# Patient Record
Sex: Male | Born: 1985 | Race: Black or African American | Hispanic: No | Marital: Single | Smoking: Current every day smoker
Health system: Southern US, Community
[De-identification: ages and names within clinical notes are randomized; demographics above are authoritative.]

---
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
## 2007-05-12 IMAGING — CT CT HEAD WITHOUT CONTRAST
2 series · 16 of 30 positions shown, 20 images · non-contrast
Comparison: none

REASON FOR EXAM: syncope
COMMENTS:

[Series 2: without · axial · non-contrast · 0.43mm/px · z∈[-151,-21]mm · 13 of 32 slices shown, 17 images]
[im 3/32  brain]
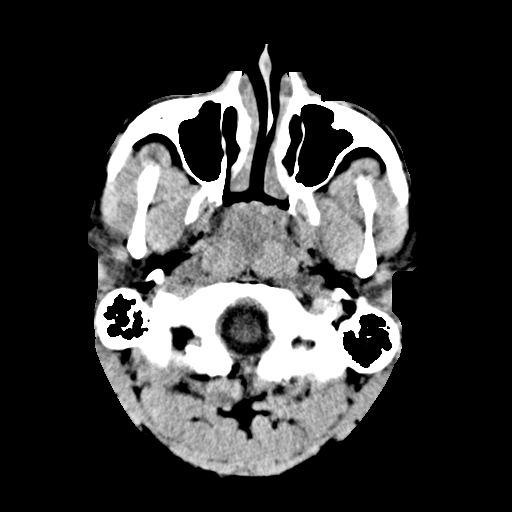
[im 3/32  bone]
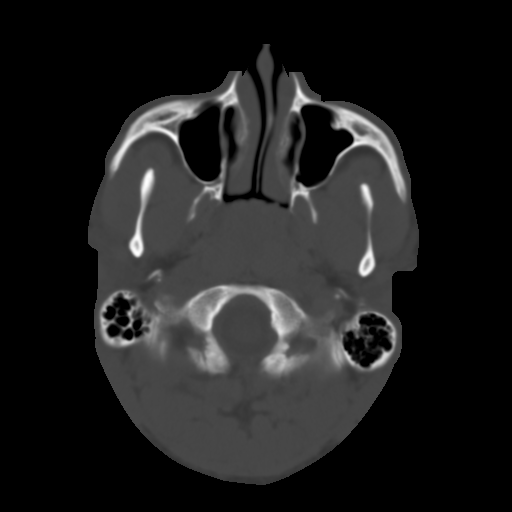
[im 5/32  brain]
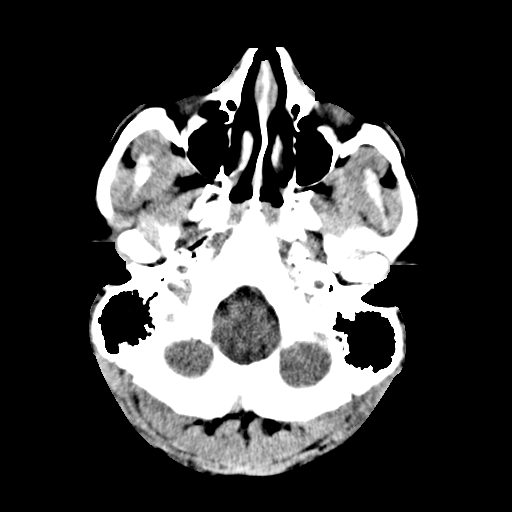
[im 7/32  brain]
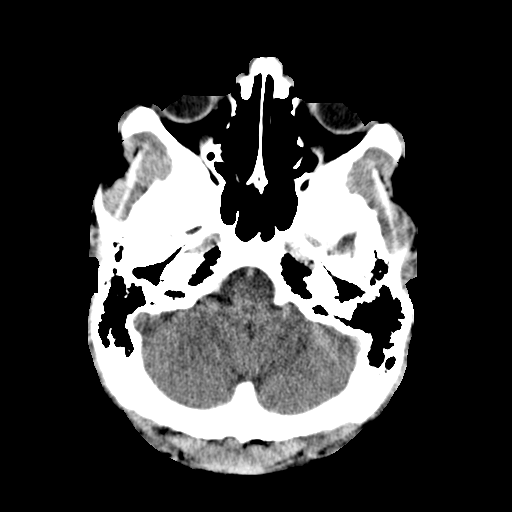
[im 9/32  brain]
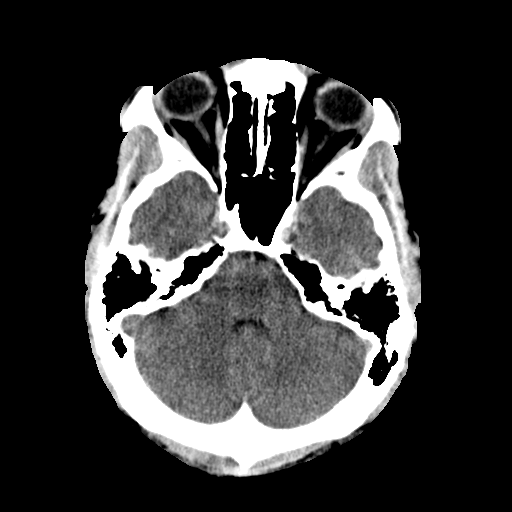
[im 12/32  brain]
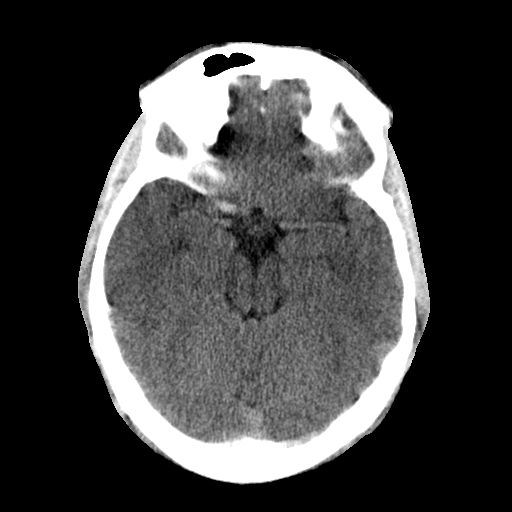
[im 12/32  bone]
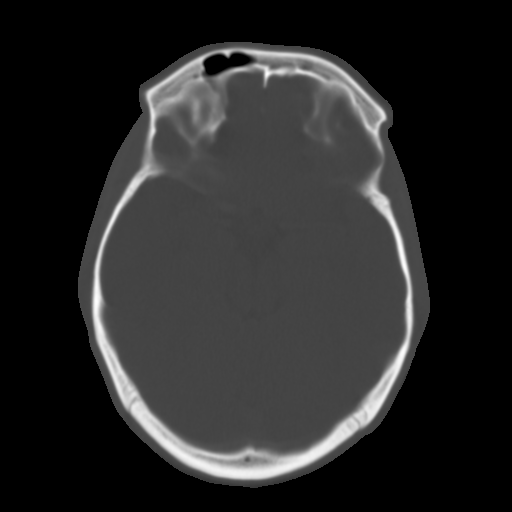
[im 14/32  brain]
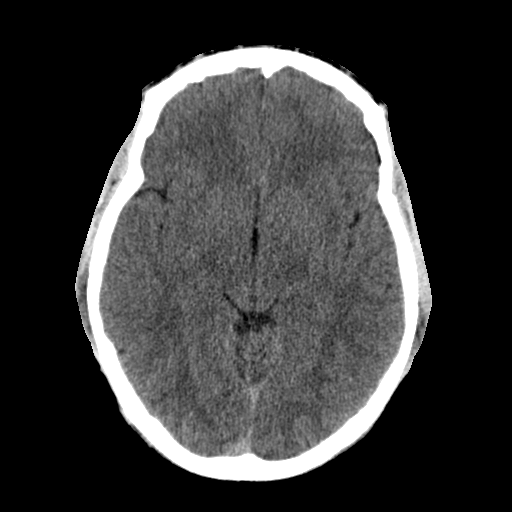
[im 16/32  brain]
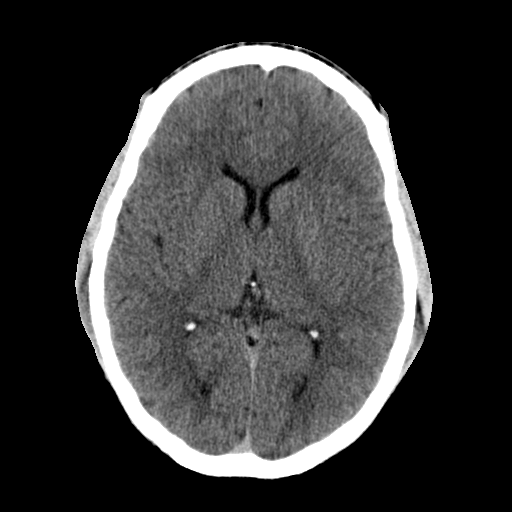
[im 18/32  brain]
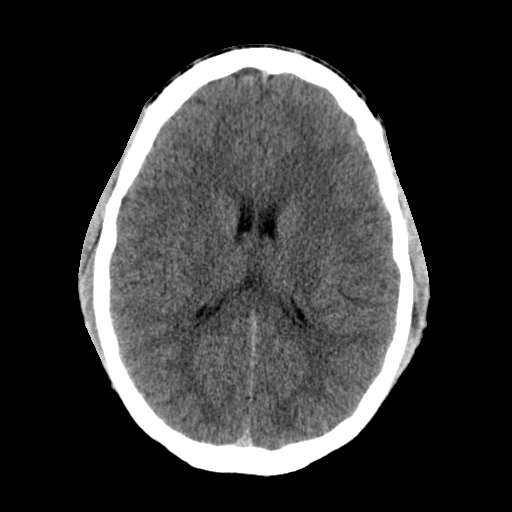
[im 20/32  brain]
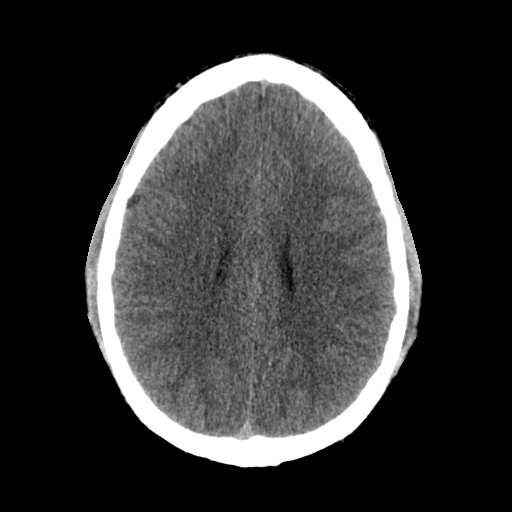
[im 20/32  bone]
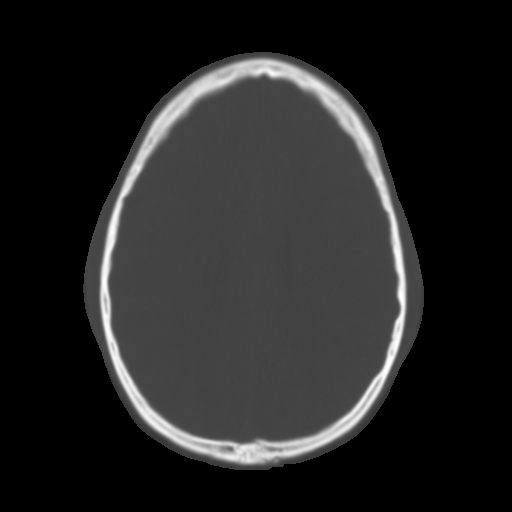
[im 23/32  brain]
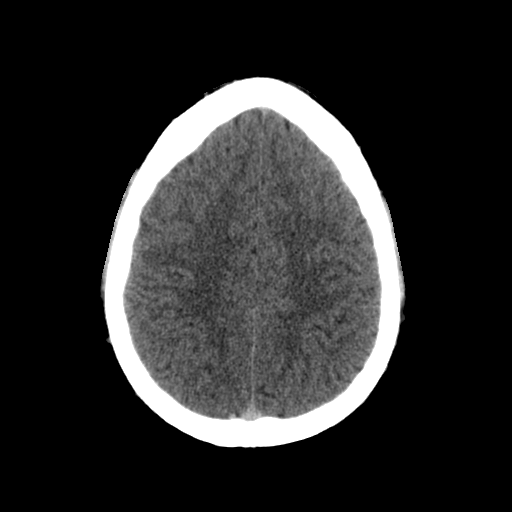
[im 25/32  brain]
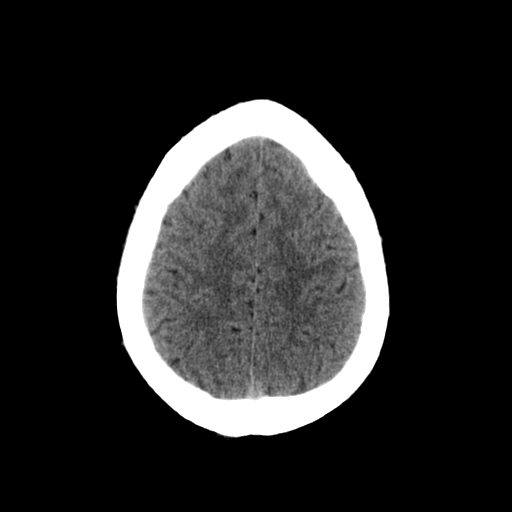
[im 27/32  brain]
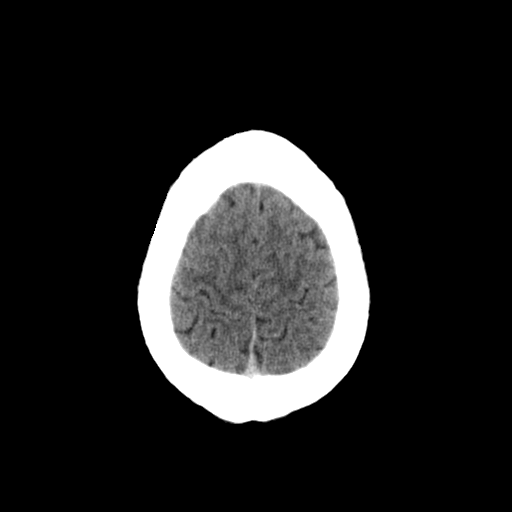
[im 29/32  brain]
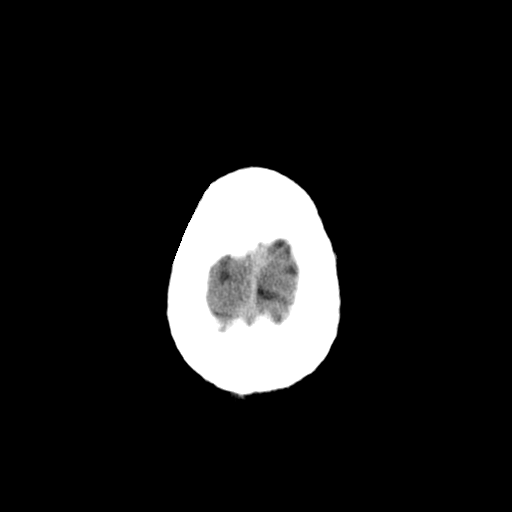
[im 29/32  bone]
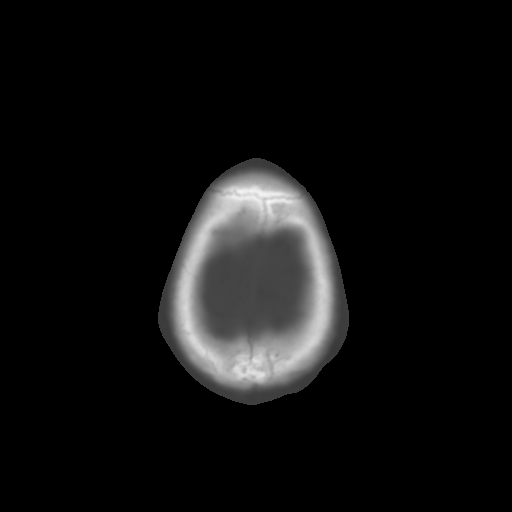

[Series 3: bone · axial · 0.43mm/px · z∈[-151,-106]mm · 3 of 32 slices shown]
[im 3/32  bone]
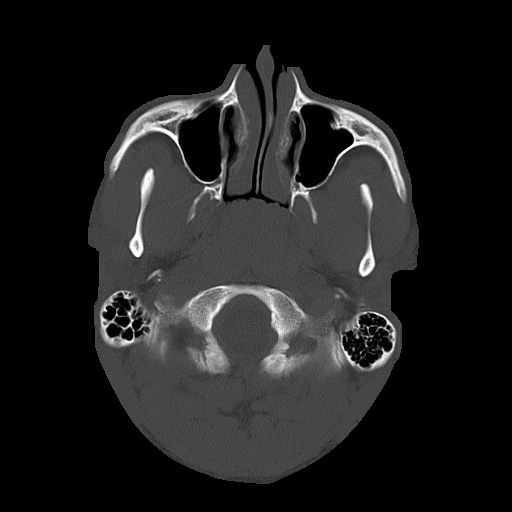
[im 7/32  bone]
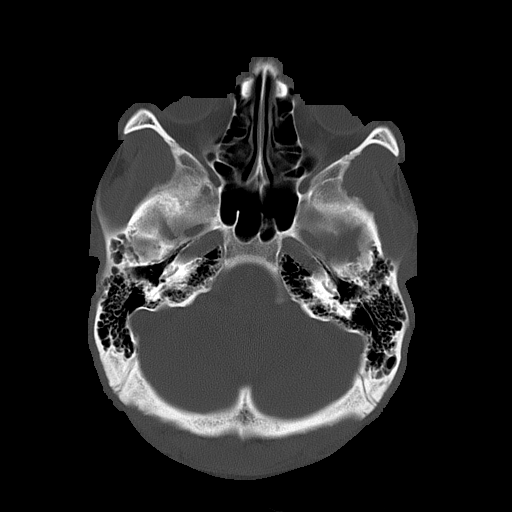
[im 12/32  bone]
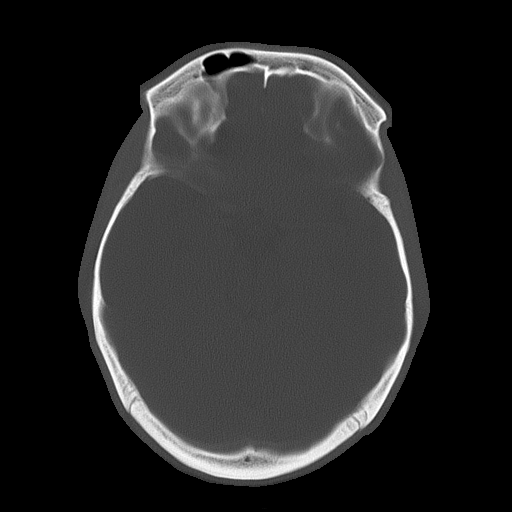

[16 of 30 positions shown; findings below may reference images not displayed]

PROCEDURE:     CT  - CT HEAD WITHOUT CONTRAST  - April 19, 2006  [DATE]

RESULT:     This is a re-dictation of a previously dictated report.

There is no evidence of intraaxial nor extraaxial fluid collections nor
evidence of acute hemorrhage.  No secondary signs are appreciated to suggest
mass effect, subacute or chronic infarction.
IMPRESSION: Dr. Klever of the emergency department was informed of these findings at the
time of the initial interpretation on 04/19/2006.

## 2009-09-03 ENCOUNTER — Emergency Department: Payer: Self-pay | Admitting: Emergency Medicine

## 2012-07-01 ENCOUNTER — Emergency Department: Payer: Self-pay | Admitting: Emergency Medicine

## 2012-07-01 LAB — CBC WITH DIFFERENTIAL/PLATELET
Basophil #: 0 10*3/uL (ref 0.0–0.1)
Eosinophil #: 0 10*3/uL (ref 0.0–0.7)
HCT: 45.9 % (ref 40.0–52.0)
Lymphocyte #: 1.1 10*3/uL (ref 1.0–3.6)
MCH: 30.8 pg (ref 26.0–34.0)
MCHC: 34.6 g/dL (ref 32.0–36.0)
MCV: 89 fL (ref 80–100)
Monocyte #: 1.2 x10 3/mm — ABNORMAL HIGH (ref 0.2–1.0)
Monocyte %: 11.3 %
Neutrophil #: 8.4 10*3/uL — ABNORMAL HIGH (ref 1.4–6.5)
Platelet: 196 10*3/uL (ref 150–440)
RBC: 5.17 10*6/uL (ref 4.40–5.90)
RDW: 13.7 % (ref 11.5–14.5)
WBC: 10.8 10*3/uL — ABNORMAL HIGH (ref 3.8–10.6)

## 2012-07-01 LAB — RAPID INFLUENZA A&B ANTIGENS

## 2012-07-01 LAB — MONONUCLEOSIS SCREEN: Mono Test: NEGATIVE

## 2012-07-05 ENCOUNTER — Emergency Department: Payer: Self-pay | Admitting: Emergency Medicine

## 2012-07-05 LAB — CBC WITH DIFFERENTIAL/PLATELET
Basophil #: 0.1 10*3/uL (ref 0.0–0.1)
Basophil %: 0.4 %
Eosinophil #: 0 10*3/uL (ref 0.0–0.7)
HCT: 45.4 % (ref 40.0–52.0)
HGB: 15.4 g/dL (ref 13.0–18.0)
Lymphocyte %: 17.6 %
MCH: 30.2 pg (ref 26.0–34.0)
MCHC: 33.9 g/dL (ref 32.0–36.0)
MCV: 89 fL (ref 80–100)
Monocyte #: 1.6 x10 3/mm — ABNORMAL HIGH (ref 0.2–1.0)
Monocyte %: 13.6 %
Neutrophil #: 8.1 10*3/uL — ABNORMAL HIGH (ref 1.4–6.5)
RDW: 13.7 % (ref 11.5–14.5)
WBC: 11.8 10*3/uL — ABNORMAL HIGH (ref 3.8–10.6)

## 2012-07-07 LAB — BETA STREP CULTURE(ARMC)

## 2014-03-15 ENCOUNTER — Emergency Department: Payer: Self-pay | Admitting: Emergency Medicine

## 2014-03-15 LAB — CBC WITH DIFFERENTIAL/PLATELET
Basophil #: 0.1 10*3/uL (ref 0.0–0.1)
Basophil %: 0.9 %
EOS ABS: 0.2 10*3/uL (ref 0.0–0.7)
EOS PCT: 1.6 %
HCT: 45.4 % (ref 40.0–52.0)
HGB: 14.8 g/dL (ref 13.0–18.0)
LYMPHS PCT: 20.7 %
Lymphocyte #: 2.1 10*3/uL (ref 1.0–3.6)
MCH: 30.2 pg (ref 26.0–34.0)
MCHC: 32.6 g/dL (ref 32.0–36.0)
MCV: 93 fL (ref 80–100)
MONOS PCT: 5.7 %
Monocyte #: 0.6 x10 3/mm (ref 0.2–1.0)
Neutrophil #: 7.2 10*3/uL — ABNORMAL HIGH (ref 1.4–6.5)
Neutrophil %: 71.1 %
Platelet: 209 10*3/uL (ref 150–440)
RBC: 4.9 10*6/uL (ref 4.40–5.90)
RDW: 13.3 % (ref 11.5–14.5)
WBC: 10.2 10*3/uL (ref 3.8–10.6)

## 2014-03-15 LAB — BASIC METABOLIC PANEL
Anion Gap: 10 (ref 7–16)
BUN: 13 mg/dL (ref 7–18)
Calcium, Total: 8.6 mg/dL (ref 8.5–10.1)
Chloride: 106 mmol/L (ref 98–107)
Co2: 28 mmol/L (ref 21–32)
Creatinine: 1.24 mg/dL (ref 0.60–1.30)
EGFR (Non-African Amer.): >60
GLUCOSE: 101 mg/dL — AB (ref 65–99)
OSMOLALITY: 287 (ref 275–301)
POTASSIUM: 3.3 mmol/L — AB (ref 3.5–5.1)
SODIUM: 144 mmol/L (ref 136–145)

## 2014-03-15 LAB — URINALYSIS, COMPLETE
BLOOD: NEGATIVE
Bacteria: NONE SEEN
Bilirubin,UR: NEGATIVE
GLUCOSE, UR: NEGATIVE mg/dL (ref 0–75)
Ketone: NEGATIVE
Leukocyte Esterase: NEGATIVE
NITRITE: NEGATIVE
Ph: 7 (ref 4.5–8.0)
Protein: NEGATIVE
RBC,UR: 10 /HPF (ref 0–5)
Specific Gravity: 1.027 (ref 1.003–1.030)
WBC UR: <1 /HPF (ref 0–5)

## 2014-03-15 LAB — TROPONIN I: Troponin-I: <0.02 ng/mL

## 2014-03-19 ENCOUNTER — Emergency Department: Payer: Self-pay | Admitting: Emergency Medicine

## 2014-03-19 LAB — CBC
HCT: 49 % (ref 40.0–52.0)
HGB: 16.5 g/dL (ref 13.0–18.0)
MCH: 30.6 pg (ref 26.0–34.0)
MCHC: 33.8 g/dL (ref 32.0–36.0)
MCV: 91 fL (ref 80–100)
Platelet: 205 10*3/uL (ref 150–440)
RBC: 5.4 10*6/uL (ref 4.40–5.90)
RDW: 12.9 % (ref 11.5–14.5)
WBC: 15.1 10*3/uL — AB (ref 3.8–10.6)

## 2014-03-19 LAB — TROPONIN I: Troponin-I: <0.02 ng/mL

## 2014-03-19 LAB — BASIC METABOLIC PANEL
Anion Gap: 8 (ref 7–16)
BUN: 13 mg/dL (ref 7–18)
CHLORIDE: 100 mmol/L (ref 98–107)
CREATININE: 1.51 mg/dL — AB (ref 0.60–1.30)
Calcium, Total: 9.3 mg/dL (ref 8.5–10.1)
Co2: 29 mmol/L (ref 21–32)
EGFR (African American): >60
EGFR (Non-African Amer.): >60
Glucose: 104 mg/dL — ABNORMAL HIGH (ref 65–99)
OSMOLALITY: 274 (ref 275–301)
POTASSIUM: 3.7 mmol/L (ref 3.5–5.1)
Sodium: 137 mmol/L (ref 136–145)

## 2014-03-21 ENCOUNTER — Emergency Department: Payer: Self-pay | Admitting: Emergency Medicine

## 2014-03-24 LAB — BETA STREP CULTURE(ARMC)

## 2015-04-11 IMAGING — CR DG CHEST 2V
1 series · 2 of 2 positions shown · non-contrast
Comparison: 03/15/2014.

CLINICAL DATA: Wheezing and congestion.  Fever.

EXAM:
CHEST  2 VIEW

[Series 1: dxr chest pa (or ap) and lateral · 0.14mm/px · 2 of 2 slices shown]
[im 1/2]
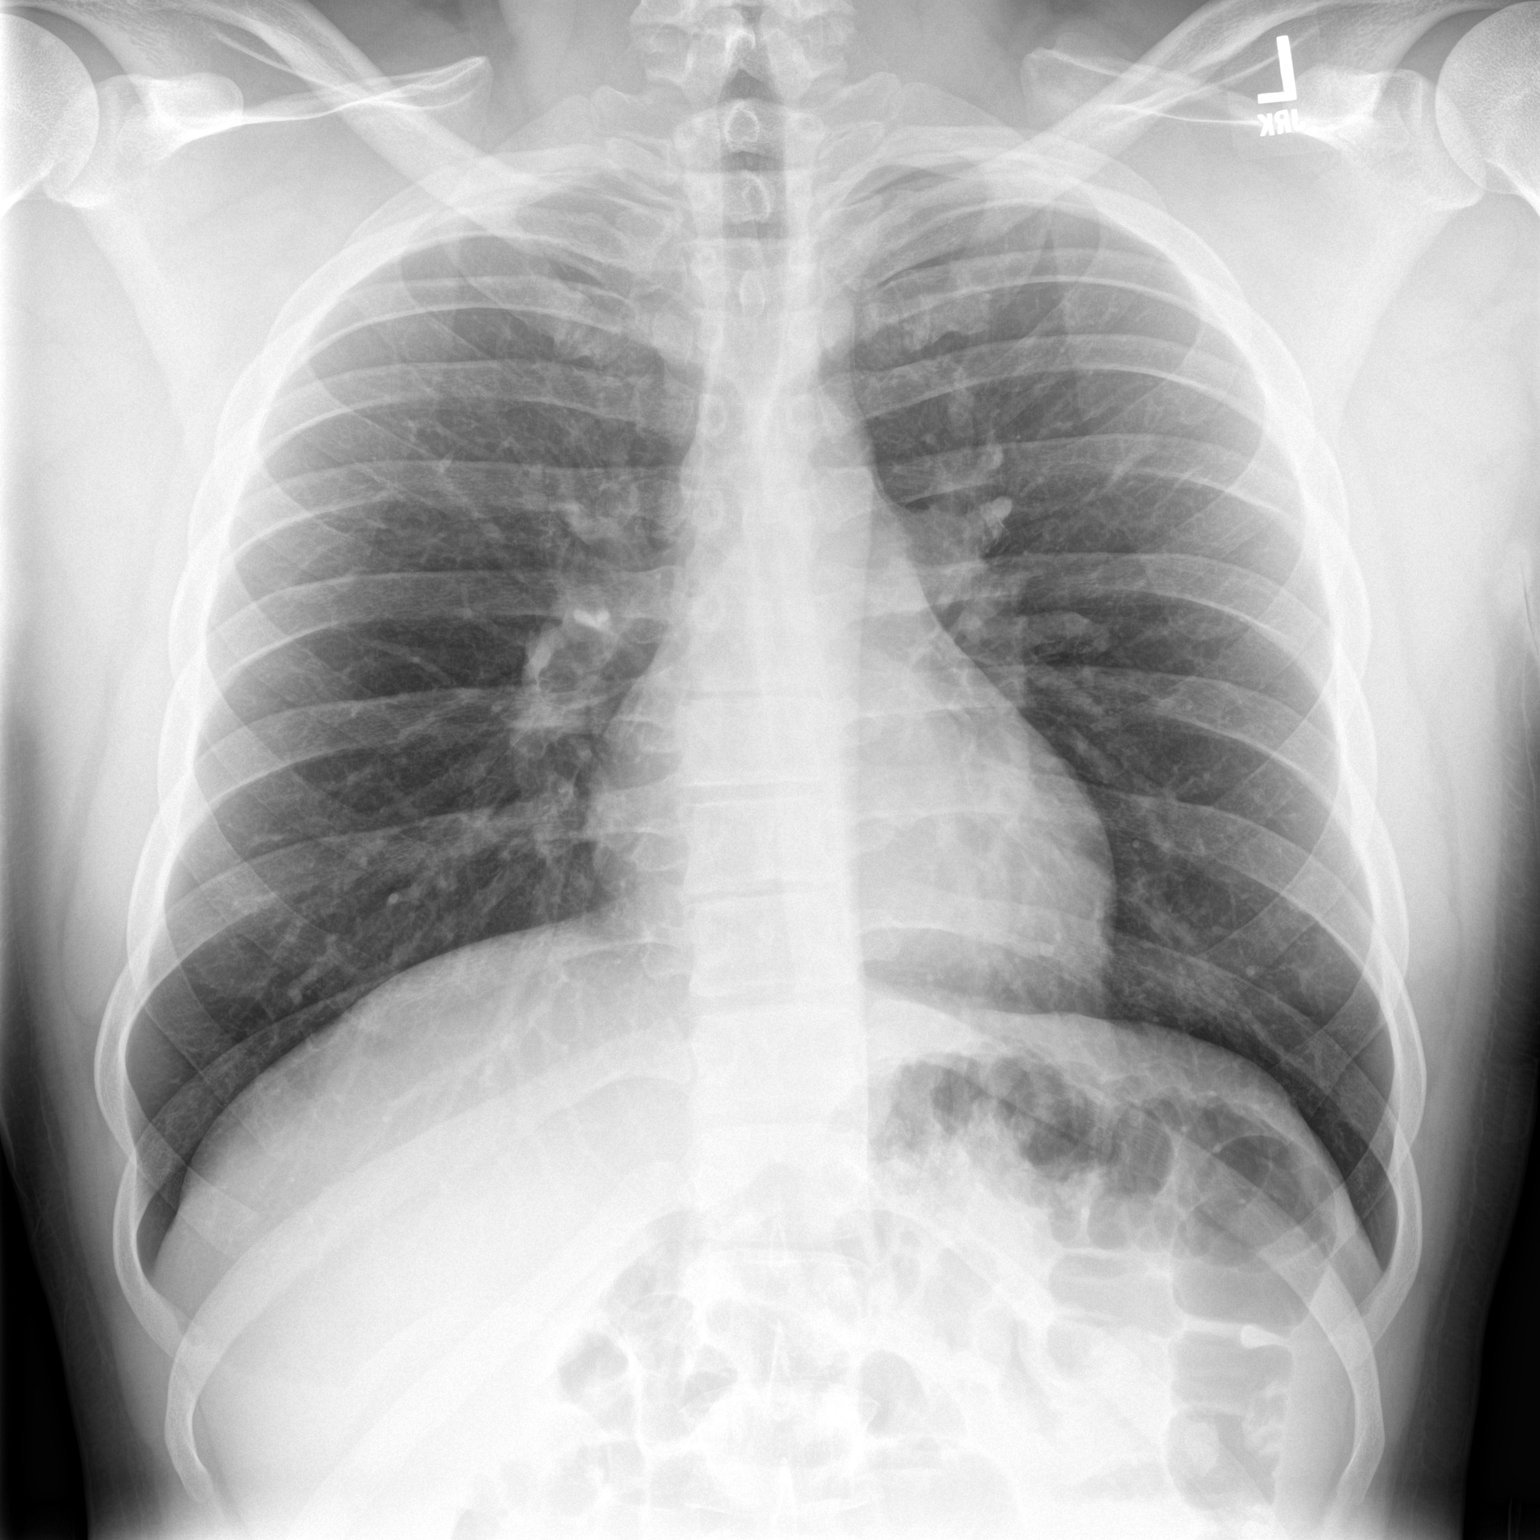
[im 2/2]
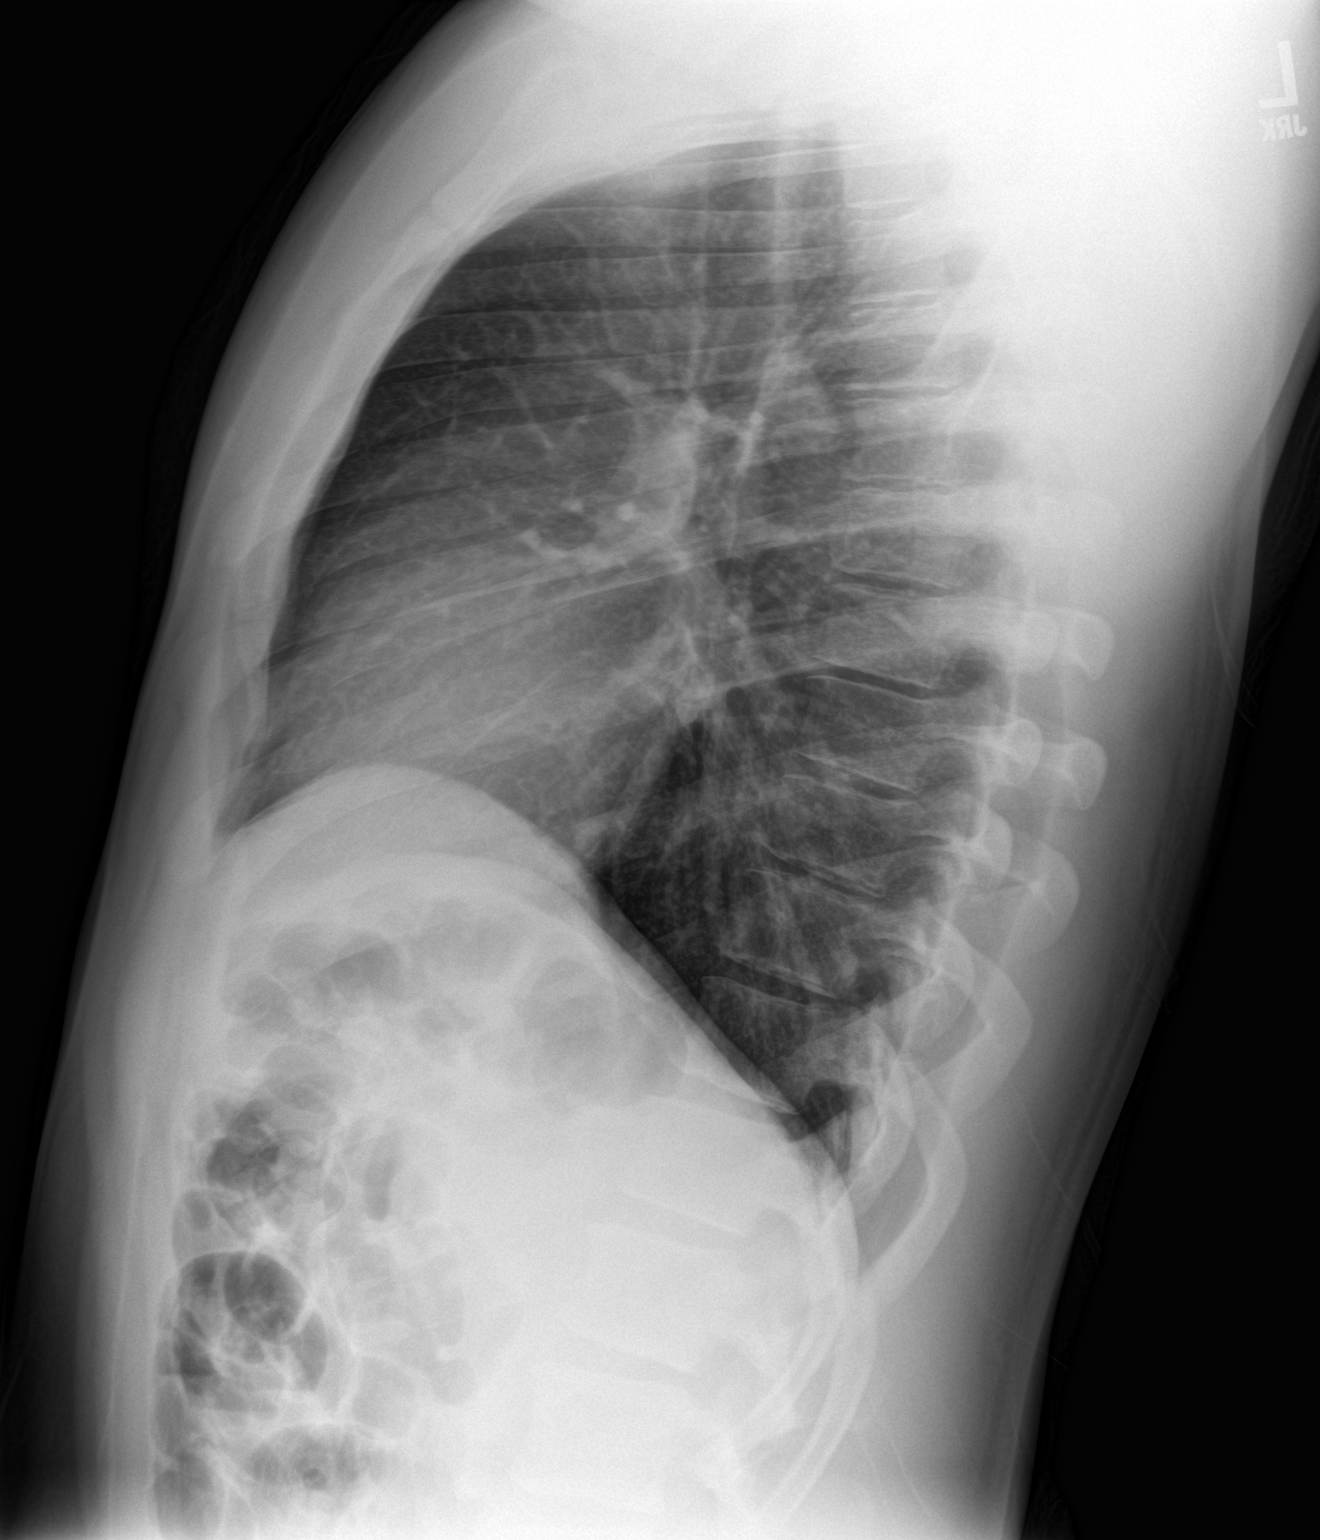

[2 of 2 positions shown; findings below may reference images not displayed]

FINDINGS: Cardiopericardial silhouette within normal limits. Mediastinal
contours normal. Trachea midline. No airspace disease or effusion.
IMPRESSION: No active cardiopulmonary disease.

## 2016-10-12 ENCOUNTER — Encounter: Payer: Self-pay | Admitting: Emergency Medicine

## 2016-10-12 ENCOUNTER — Emergency Department
Admission: EM | Admit: 2016-10-12 | Discharge: 2016-10-12 | Disposition: A | Discharge status: Home / Self Care | Payer: Self-pay | Attending: Emergency Medicine | Admitting: Emergency Medicine

## 2016-10-12 DIAGNOSIS — L03113 Cellulitis of right upper limb: Secondary | ICD-10-CM | POA: Insufficient documentation

## 2016-10-12 DIAGNOSIS — F17210 Nicotine dependence, cigarettes, uncomplicated: Secondary | ICD-10-CM | POA: Insufficient documentation

## 2016-10-12 MED ORDER — CEPHALEXIN 500 MG PO CAPS: 500.0000 mg | ORAL_CAPSULE | Freq: Two times a day (BID) | ORAL | 0 refills | Status: AC

## 2016-10-12 MED ORDER — CEPHALEXIN 500 MG PO CAPS
500.0000 mg | ORAL_CAPSULE | Freq: Once | ORAL | Status: AC
  Administered 2016-10-12: 500 mg via ORAL
  Filled 2016-10-12: qty 1

## 2016-10-12 MED ORDER — SULFAMETHOXAZOLE-TRIMETHOPRIM 800-160 MG PO TABS: 1.0000 | ORAL_TABLET | Freq: Two times a day (BID) | ORAL | 0 refills | Status: AC

## 2016-10-12 MED ORDER — SULFAMETHOXAZOLE-TRIMETHOPRIM 800-160 MG PO TABS
1.0000 | ORAL_TABLET | Freq: Once | ORAL | Status: AC
  Administered 2016-10-12: 1 via ORAL
  Filled 2016-10-12: qty 1

## 2016-10-12 NOTE — ED Notes (Signed)
Pt found in room att 

## 2016-10-12 NOTE — ED Provider Notes (Signed)
[facility] Emergency Department Provider Note   ____________________________________________    I have reviewed the triage vital signs and the nursing notes.   HISTORY  Chief Complaint Insect Bite and Hand Pain     HPI Travis Guerra. is a 31 y.o. male who presents with complaints of right hand pain and swelling. Patient reports approximately 2 days ago he was under his mother's house and either was bit by an insect or poked his hand on something sharp. He didn't think anything of it but yesterday evening developed mild redness and burning pain and swelling in between second and third fingers on the dorsum of his hand. No fevers or chills. No other injury. Tetanus is up-to-date   History reviewed. No pertinent past medical history.  There are no active problems to display for this patient.   History reviewed. No pertinent surgical history.  Prior to Admission medications   Medication Sig Start Date End Date Taking? Authorizing Provider  cephALEXin (KEFLEX) 500 MG capsule Take 1 capsule (500 mg total) by mouth 2 (two) times daily. 10/12/16   Jene Every, MD  sulfamethoxazole-trimethoprim (BACTRIM DS,SEPTRA DS) 800-160 MG tablet Take 1 tablet by mouth 2 (two) times daily. 10/12/16   Jene Every, MD     Allergies Patient has no known allergies.  History reviewed. No pertinent family history.  Social History Social History  Substance Use Topics  . Smoking status: Current Every Day Smoker    Packs/day: 0.50    Types: Cigarettes  . Smokeless tobacco: Never Used  . Alcohol use No    Review of Systems  Constitutional: No fever/chills     Gastrointestinal:No nausea, no vomiting.    Musculoskeletal: Negative forJoint pain, full range of motion of fingers Skin: .No abnormal sweating Neurological: Negative for headaches     ____________________________________________   PHYSICAL EXAM:  VITAL SIGNS: ED Triage Vitals  Enc  Vitals Group     BP 10/12/16 0302 123/80     Pulse Rate 10/12/16 0302 80     Resp 10/12/16 0302 18     Temp 10/12/16 0302 98.3 F (36.8 C)     Temp Source 10/12/16 0302 Oral     SpO2 10/12/16 0302 100 %     Weight 10/12/16 0304 210 lb (95.3 kg)     Height 10/12/16 0304  (1.803 m)     Head Circumference --      Peak Flow --      Pain Score --      Pain Loc --      Pain Edu? --      Excl. in GC? --     Constitutional: Alert and oriented. No acute distress. Pleasant and interactive  Nose: No congestion/rhinnorhea. Mouth/Throat: Mucous membranes are moist.   Cardiovascular: Normal rate, regular rhythm.  Respiratory: Normal respiratory effort.  No retractions. Genitourinary: deferred Musculoskeletal: Dorsum of right hand: Small area of erythema at the base of the second finger, mild swelling noted. Erythema extends approximately half way across the dorsum of the hand. Swelling is localized to the base of the second finger. No flexor tenderness, able to extend and flex fingers completely without pain. No "sausage digit. "Warm and well perfused " no fluctuance Neurologic:  Normal speech and language. No gross focal neurologic deficits are appreciated.   Skin:  Skin is warm, dry and intact. As above   ____________________________________________   LABS (all labs ordered are listed, but only abnormal results are displayed)  Labs Reviewed - No data to display ____________________________________________  EKG   ____________________________________________  RADIOLOGY  None ____________________________________________   PROCEDURES  Procedure(s) performed: No    Critical Care performed: No ____________________________________________   INITIAL IMPRESSION / ASSESSMENT AND PLAN / ED COURSE  Pertinent labs & imaging results that were available during my care of the patient were reviewed by me and considered in my medical decision making (see chart for  details).  Patient well-appearing and in no acute distress. Vital signs normal. Afebrile. Exam is most consistent with mild early cellulitis. We will treat with Bactrim and Keflex. Discussed with the patient the need to return if any worsening of symptoms, fevers, worsening swelling etc.   ____________________________________________   FINAL CLINICAL IMPRESSION(S) / ED DIAGNOSES  Final diagnoses:  Cellulitis of right upper extremity      NEW MEDICATIONS STARTED DURING THIS VISIT:  New Prescriptions   CEPHALEXIN (KEFLEX) 500 MG CAPSULE    Take 1 capsule (500 mg total) by mouth 2 (two) times daily.   SULFAMETHOXAZOLE-TRIMETHOPRIM (BACTRIM DS,SEPTRA DS) 800-160 MG TABLET    Take 1 tablet by mouth 2 (two) times daily.     Note:  This document was prepared using Dragon voice recognition software and may include unintentional dictation errors.    Jene Every, MD 10/12/16 [phone]
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
## 2022-07-02 ENCOUNTER — Other Ambulatory Visit: Payer: Self-pay

## 2022-07-02 ENCOUNTER — Emergency Department: Payer: No Typology Code available for payment source

## 2022-07-02 ENCOUNTER — Emergency Department
Admission: EM | Admit: 2022-07-02 | Discharge: 2022-07-02 | Disposition: A | Discharge status: Home / Self Care | Payer: No Typology Code available for payment source | Attending: Emergency Medicine | Admitting: Emergency Medicine

## 2022-07-02 DIAGNOSIS — M7918 Myalgia, other site: Secondary | ICD-10-CM

## 2022-07-02 DIAGNOSIS — Y92410 Unspecified street and highway as the place of occurrence of the external cause: Secondary | ICD-10-CM | POA: Insufficient documentation

## 2022-07-02 DIAGNOSIS — M79662 Pain in left lower leg: Secondary | ICD-10-CM | POA: Insufficient documentation

## 2022-07-02 MED ORDER — CYCLOBENZAPRINE HCL 5 MG PO TABS: 5.0000 mg | ORAL_TABLET | Freq: Three times a day (TID) | ORAL | 0 refills | Status: AC | PRN

## 2022-07-02 MED ORDER — IBUPROFEN 800 MG PO TABS: 800.0000 mg | ORAL_TABLET | Freq: Three times a day (TID) | ORAL | 0 refills | Status: AC | PRN
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
## 2022-07-02 NOTE — Discharge Instructions (Signed)
Your exam, XR, and CT scans are normal and reassuring. You can expect to be sore and stiff for a few days. Take the prescription meds as directed.

## 2023-07-06 ENCOUNTER — Emergency Department
Admission: EM | Admit: 2023-07-06 | Discharge: 2023-07-06 | Disposition: A | Discharge status: Home / Self Care | Payer: Self-pay | Attending: Emergency Medicine | Admitting: Emergency Medicine

## 2023-07-06 ENCOUNTER — Emergency Department: Payer: Self-pay

## 2023-07-06 ENCOUNTER — Other Ambulatory Visit: Payer: Self-pay

## 2023-07-06 DIAGNOSIS — D72829 Elevated white blood cell count, unspecified: Secondary | ICD-10-CM | POA: Insufficient documentation

## 2023-07-06 DIAGNOSIS — K8500 Idiopathic acute pancreatitis without necrosis or infection: Secondary | ICD-10-CM | POA: Insufficient documentation

## 2023-07-06 DIAGNOSIS — K529 Noninfective gastroenteritis and colitis, unspecified: Secondary | ICD-10-CM | POA: Insufficient documentation

## 2023-07-06 LAB — CBC
HCT: 47.4 % (ref 39.0–52.0)
Hemoglobin: 15.9 g/dL (ref 13.0–17.0)
MCH: 31 pg (ref 26.0–34.0)
MCHC: 33.5 g/dL (ref 30.0–36.0)
MCV: 92.4 fL (ref 80.0–100.0)
Platelets: 268 10*3/uL (ref 150–400)
RBC: 5.13 MIL/uL (ref 4.22–5.81)
RDW: 13.6 % (ref 11.5–15.5)
WBC: 11.5 10*3/uL — ABNORMAL HIGH (ref 4.0–10.5)
nRBC: 0 % (ref 0.0–0.2)

## 2023-07-06 LAB — HEPATIC FUNCTION PANEL
ALT: 21 U/L (ref 0–44)
AST: 20 U/L (ref 15–41)
Albumin: 4.3 g/dL (ref 3.5–5.0)
Alkaline Phosphatase: 70 U/L (ref 38–126)
Bilirubin, Direct: 0.2 mg/dL (ref 0.0–0.2)
Indirect Bilirubin: 0.6 mg/dL (ref 0.3–0.9)
Total Bilirubin: 0.8 mg/dL (ref 0.0–1.2)
Total Protein: 7.7 g/dL (ref 6.5–8.1)

## 2023-07-06 LAB — LIPASE, BLOOD: Lipase: 153 U/L — ABNORMAL HIGH (ref 11–51)

## 2023-07-06 LAB — BASIC METABOLIC PANEL
Anion gap: 12 (ref 5–15)
BUN: 13 mg/dL (ref 6–20)
CO2: 26 mmol/L (ref 22–32)
Calcium: 9.3 mg/dL (ref 8.9–10.3)
Chloride: 101 mmol/L (ref 98–111)
Creatinine, Ser: 1.15 mg/dL (ref 0.61–1.24)
GFR, Estimated: >60 mL/min (ref >60)
Glucose, Bld: 116 mg/dL — ABNORMAL HIGH (ref 70–99)
Potassium: 3.3 mmol/L — ABNORMAL LOW (ref 3.5–5.1)
Sodium: 139 mmol/L (ref 135–145)

## 2023-07-06 LAB — TROPONIN I (HIGH SENSITIVITY)
Troponin I (High Sensitivity): 4 ng/L (ref <18)
Troponin I (High Sensitivity): 3 ng/L (ref <18)

## 2023-07-06 LAB — TRIGLYCERIDES: Triglycerides: 112 mg/dL (ref <150)

## 2023-07-06 MED ORDER — ONDANSETRON HCL 4 MG/2ML IJ SOLN
4.0000 mg | Freq: Once | INTRAMUSCULAR | Status: AC
  Administered 2023-07-06: 4 mg via INTRAVENOUS
  Filled 2023-07-06: qty 2

## 2023-07-06 MED ORDER — SODIUM CHLORIDE 0.9 % IV BOLUS
1000.0000 mL | Freq: Once | INTRAVENOUS | Status: AC
  Administered 2023-07-06: 1000 mL via INTRAVENOUS

## 2023-07-06 MED ORDER — ONDANSETRON 4 MG PO TBDP: 4.0000 mg | ORAL_TABLET | Freq: Three times a day (TID) | ORAL | 0 refills | Status: AC | PRN

## 2023-07-06 MED ORDER — HYDROMORPHONE HCL 1 MG/ML IJ SOLN
0.5000 mg | Freq: Once | INTRAMUSCULAR | Status: AC
  Administered 2023-07-06: 0.5 mg via INTRAVENOUS
  Filled 2023-07-06: qty 0.5

## 2023-07-06 MED ORDER — OXYCODONE-ACETAMINOPHEN 5-325 MG PO TABS
1.0000 | ORAL_TABLET | ORAL | Status: DC | PRN
  Administered 2023-07-06: 1 via ORAL
  Filled 2023-07-06: qty 1

## 2023-07-06 MED ORDER — OXYCODONE HCL 5 MG PO TABS: 5.0000 mg | ORAL_TABLET | Freq: Four times a day (QID) | ORAL | 0 refills | Status: AC | PRN

## 2023-07-06 NOTE — ED Provider Triage Note (Signed)
Emergency Medicine Provider Triage Evaluation Note  Shyheim Zazueta. , a 37 y.o. male  was evaluated in triage.  Pt complains of left sided abdominal pain with N,V,D that started last night. No fever known.    Review of Systems  Positive: Pain left lateral abdomen.  + reflux sx, n,v,d. Negative: No cough or URI sx.  Physical Exam  BP (!) 153/91 (BP Location: Right Arm)   Pulse (!) 47   Temp 97.8 F (36.6 C) (Oral)   Resp 17   Ht 5\' 11"  (1.803 m)   Wt 95.3 kg   SpO2 99%   BMI 29.30 kg/m  Gen:   Awake, no distress   Resp:  Normal effort, Lungs clear.  MSK:   Moves extremities without difficulty  Other:    Medical Decision Making  Medically screening exam initiated at 9:01 AM.  Appropriate orders placed.  Rhone Elder Negus. was informed that the remainder of the evaluation will be completed by another provider, this initial triage assessment does not replace that evaluation, and the importance of remaining in the ED until their evaluation is complete.     Tommi Rumps, PA-C 07/06/23 [phone]

## 2023-07-06 NOTE — ED Provider Notes (Signed)
[facility] Provider Note    Event Date/Time   First MD Initiated Contact with Patient 07/06/23 1246     (approximate)   History   Chest Pain   HPI  Travis Guerra. is a 37 y.o. male who comes in with nausea vomiting diarrhea abdominal pain.  Patient comes in stating that he has been having some upper abdominal pain with his symptoms.  He denies any daily drinking.  Denies any history of pancreatitis.  He denies any significant lower abdominal pain.  He denies any prior abdominal surgeries.  He denies this ever happening previously  Physical Exam   Triage Vital Signs: ED Triage Vitals  Encounter Vitals Group     BP 07/06/23 0859 (!) 153/91     Systolic BP Percentile --      Diastolic BP Percentile --      Pulse Rate 07/06/23 0858 (!) 47     Resp 07/06/23 0858 17     Temp 07/06/23 0858 97.8 F (36.6 C)     Temp Source 07/06/23 0858 Oral     SpO2 07/06/23 0858 99 %     Weight 07/06/23 0859 210 lb 1.6 oz (95.3 kg)     Height 07/06/23 0859 5\' 11"  (1.803 m)     Head Circumference --      Peak Flow --      Pain Score 07/06/23 0858 0     Pain Loc --      Pain Education --      Exclude from Growth Chart --     Most recent vital signs: Vitals:   07/06/23 0858 07/06/23 0859  BP:  (!) 153/91  Pulse: (!) 47   Resp: 17   Temp: 97.8 F (36.6 C)   SpO2: 99%      General: Awake, no distress.  CV:  Good peripheral perfusion.  Resp:  Normal effort.  Abd:  No distention.  Tender in the upper abdomen.  No right lower quadrant pain.  No rebound, no guarding Other:     ED Results / Procedures / Treatments   Labs (all labs ordered are listed, but only abnormal results are displayed) Labs Reviewed  BASIC METABOLIC PANEL - Abnormal; Notable for the following components:      Result Value   Potassium 3.3 (*)    Glucose, Bld 116 (*)    All other components within normal limits  CBC - Abnormal; Notable for the following components:   WBC 11.5  (*)    All other components within normal limits  LIPASE, BLOOD - Abnormal; Notable for the following components:   Lipase 153 (*)    All other components within normal limits  HEPATIC FUNCTION PANEL  TROPONIN I (HIGH SENSITIVITY)  TROPONIN I (HIGH SENSITIVITY)     EKG  My interpretation of EKG:  Sinus bradycardia rate of 49 without any ST elevations or T wave inversions, normal intervals  RADIOLOGY I have reviewed the ultrasound personally interpreted no gallstones    PROCEDURES:  Critical Care performed: No  Procedures   MEDICATIONS ORDERED IN ED: Medications  oxyCODONE-acetaminophen (PERCOCET/ROXICET) 5-325 MG per tablet 1 tablet (1 tablet Oral Given 07/06/23 1040)  sodium chloride 0.9 % bolus 1,000 mL (1,000 mLs Intravenous New Bag/Given 07/06/23 1402)  ondansetron (ZOFRAN) injection 4 mg (4 mg Intravenous Given 07/06/23 1402)  HYDROmorphone (DILAUDID) injection 0.5 mg (0.5 mg Intravenous Given 07/06/23 1403)     IMPRESSION / MDM / ASSESSMENT AND PLAN /  ED COURSE  I reviewed the triage vital signs and the nursing notes.   Patient's presentation is most consistent with acute presentation with potential threat to life or bodily function.   Patient comes in with nausea vomiting diarrhea sounds most likely gastroenteritis but he is tender in his upper abdomen therefore ultrasound ordered evaluate for gallstones, labs to evaluate for pancreatitis.  Tropes are negative x 2.  BMP is reassuring.  White count is slightly elevated 11.5.  Troponin is negative lipase is elevated   Patient had asked to go to the bathroom and he denied any urinary symptoms.  He states that while in the bathroom he*developing nausea, vomiting.  He states that because of his symptoms he lowered himself down to the ground.  He denies falling and hitting his head.  We were able to get an additional person to help stand him up and get him back into the bed.  His vomit looked nonbloody.  I discussed  admission to the patient for his pancreatitis but at this time he is declining.   Patient was given some fluids, IV Dilaudid IV Zofran.  He is now tolerating p.o.  Given how severe patient's symptoms were I did recommend admission to the patient but patient does not want to stay in the hospital.  He is requesting to be discharged home.  He was able to drink some clear liquids.  Repeat abdominal exam continues do not have any rebound, no guarding and no right lower quadrant pain to suggest appendicitis.      FINAL CLINICAL IMPRESSION(S) / ED DIAGNOSES   Final diagnoses:  Idiopathic acute pancreatitis, unspecified complication status  Gastroenteritis     Rx / DC Orders   ED Discharge Orders          Ordered    oxyCODONE (ROXICODONE) 5 MG immediate release tablet  Every 6 hours PRN        07/06/23 1505    ondansetron (ZOFRAN-ODT) 4 MG disintegrating tablet  Every 8 hours PRN        07/06/23 1505             Note:  This document was prepared using Dragon voice recognition software and may include unintentional dictation errors.   Concha Se, MD 07/06/23 [phone]

## 2023-07-06 NOTE — Discharge Instructions (Addendum)
We discussed admission for your pancreatitis but you have opted to want to go home.  You can take Tylenol 1 g every 8 hours for pain and use the oxycodone for breakthrough pain.  Do not drive or work while on this.  Use the Zofran to help with your nausea.  Important that you do clear liquids   (clears is probably ok for mild/moderate cases) When restarting diet, eat small, low-fat meals and gradually advance over 3 to 6 days as tolerated

## 2023-07-06 NOTE — ED Notes (Addendum)
Pt given 8 oz of water to drink and tolerated well. Denies N/V, states he feels good drinking it. Pain meds provided relief. EDP notified.

## 2023-07-06 NOTE — ED Triage Notes (Signed)
Pt presents to ED with c/o of CP and ABD pain. Pt states ABD pain as well. Pt endorsed N/V/D.

## 2024-06-19 ENCOUNTER — Other Ambulatory Visit: Payer: Self-pay

## 2024-06-19 ENCOUNTER — Emergency Department
Admission: EM | Admit: 2024-06-19 | Discharge: 2024-06-19 | Disposition: A | Discharge status: Home / Self Care | Payer: Self-pay | Attending: Emergency Medicine | Admitting: Emergency Medicine

## 2024-06-19 DIAGNOSIS — Z23 Encounter for immunization: Secondary | ICD-10-CM | POA: Insufficient documentation

## 2024-06-19 DIAGNOSIS — S61217A Laceration without foreign body of left little finger without damage to nail, initial encounter: Secondary | ICD-10-CM | POA: Insufficient documentation

## 2024-06-19 DIAGNOSIS — W268XXA Contact with other sharp object(s), not elsewhere classified, initial encounter: Secondary | ICD-10-CM | POA: Insufficient documentation

## 2024-06-19 MED ORDER — LIDOCAINE HCL (PF) 1 % IJ SOLN
5.0000 mL | Freq: Once | INTRAMUSCULAR | Status: AC
  Administered 2024-06-19: 5 mL via INTRADERMAL
  Filled 2024-06-19: qty 5

## 2024-06-19 MED ORDER — TETANUS-DIPHTH-ACELL PERTUSSIS 5-2-15.5 LF-MCG/0.5 IM SUSP
0.5000 mL | Freq: Once | INTRAMUSCULAR | Status: AC
  Administered 2024-06-19: 0.5 mL via INTRAMUSCULAR
  Filled 2024-06-19: qty 0.5

## 2024-06-19 NOTE — Discharge Instructions (Addendum)
 Your tetanus shot was updated today.  The stitches will need to come out in 7 to 10 days.  Please wash the wound with soap and water daily, pat dry and then cover with a bandage.  If the Band-Aid gets wet please change it out for dry wound.  Avoid soaking your hands in water for long periods of time as this will cause the skin to soften and the stitches may rip through.  Watch for signs of infection including redness, warmth, swelling, pain and pus drainage.  If you develop any of these please return to the ED, urgent care or your primary care provider.

## 2024-06-19 NOTE — ED Notes (Signed)
 ED Provider at bedside.

## 2024-06-19 NOTE — ED Triage Notes (Signed)
 Pt to ED for laceration to L pinkie finger while working under car today. No crush injury. States may need stitches. Currently bandaged.

## 2024-06-19 NOTE — ED Provider Notes (Signed)
 [facility] Provider Note    Event Date/Time   First MD Initiated Contact with Patient 06/19/24 1758     (approximate)   History   Laceration   HPI  Travis Guerra. is a 38 y.o. male with no PMH who presents for evaluation of a laceration to the left pinky finger.  Patient was working under a car today when he cut his hand.  There is no crush injury.        Physical Exam   Triage Vital Signs: ED Triage Vitals  Encounter Vitals Group     BP 06/19/24 1744 (!) 153/87     Girls Systolic BP Percentile --      Girls Diastolic BP Percentile --      Boys Systolic BP Percentile --      Boys Diastolic BP Percentile --      Pulse Rate 06/19/24 1744 (!) 58     Resp 06/19/24 1744 16     Temp 06/19/24 1744 97.8 F (36.6 C)     Temp Source 06/19/24 1744 Oral     SpO2 06/19/24 1744 97 %     Weight 06/19/24 1743 215 lb (97.5 kg)     Height 06/19/24 1743 6' (1.829 m)     Head Circumference --      Peak Flow --      Pain Score 06/19/24 1740 7     Pain Loc --      Pain Education --      Exclude from Growth Chart --     Most recent vital signs: Vitals:   06/19/24 1744  BP: (!) 153/87  Pulse: (!) 58  Resp: 16  Temp: 97.8 F (36.6 C)  SpO2: 97%   General: Awake, no distress.  CV:  Good peripheral perfusion.  Resp:  Normal effort.  Abd:  No distention.  Other:  Approximately 2 cm laceration just distal to the first IP joint in the pinky finger, subcutaneous fat exposed, patient able to fully flex and extend the finger, neurovascularly intact distally to the wound   ED Results / Procedures / Treatments   Labs (all labs ordered are listed, but only abnormal results are displayed) Labs Reviewed - No data to display   PROCEDURES:  Critical Care performed: No  .Laceration Repair  Date/Time: 06/19/2024 11:24 PM  Performed by: Cleaster Tinnie LABOR, PA-C Authorized by: Cleaster Tinnie LABOR, PA-C   Consent:    Consent obtained:  Verbal    Consent given by:  Patient   Risks, benefits, and alternatives were discussed: yes     Risks discussed:  Infection, pain, retained foreign body, poor cosmetic result and poor wound healing   Alternatives discussed:  No treatment Universal protocol:    Patient identity confirmed:  Verbally with patient Anesthesia:    Anesthesia method:  Local infiltration and nerve block   Local anesthetic:  Lidocaine  1% w/o epi   Block location:  Base of the pinky finger   Block needle gauge:  25 G   Block anesthetic:  Lidocaine  1% w/o epi   Block technique:  Flexor tendon sheath   Block injection procedure:  Anatomic landmarks identified, introduced needle and incremental injection   Block outcome:  Incomplete block Laceration details:    Location:  Finger   Finger location:  L small finger   Length (cm):  2   Depth (mm):  5 Pre-procedure details:    Preparation:  Patient was prepped and draped in  usual sterile fashion Exploration:    Hemostasis achieved with:  Tourniquet   Wound exploration: wound explored through full range of motion and entire depth of wound visualized     Contaminated: no   Treatment:    Area cleansed with:  Povidone-iodine   Amount of cleaning:  Standard   Irrigation solution:  Sterile saline   Irrigation method:  Syringe Skin repair:    Repair method:  Sutures   Suture size:  5-0   Suture material:  Nylon (Ethilon)   Suture technique:  Simple interrupted   Number of sutures:  4 Approximation:    Approximation:  Close Repair type:    Repair type:  Simple Post-procedure details:    Dressing:  Adhesive bandage   Procedure completion:  Tolerated with difficulty    MEDICATIONS ORDERED IN ED: Medications  lidocaine  (PF) (XYLOCAINE ) 1 % injection 5 mL (5 mLs Intradermal Given by Other 06/19/24 1836)  Tdap (ADACEL ) injection 0.5 mL (0.5 mLs Intramuscular Given 06/19/24 2022)     IMPRESSION / MDM / ASSESSMENT AND PLAN / ED COURSE  I reviewed the triage vital signs  and the nursing notes.                             38 year old male presents for evaluation of laceration to left pinky finger.  Patient was hypertensive and bradycardic upon presentation.  He is very anxious about the procedure.  Vital signs stable otherwise.  Differential diagnosis includes, but is not limited to, laceration, less likely tendon injury, nerve injury, vascular injury.  Patient's presentation is most consistent with acute, uncomplicated illness.  Patient's tetanus shot was updated today.  Do not suspect tendon, nerve or vascular injury as patient fully flex and extend the finger and was neurovascularly intact distal to the wound.  Laceration repaired as described in the procedure note above.  Patient was instructed on wound care.  Stitches will need to be removed in 7 to 10 days.  Patient voiced understanding, all questions were answered and he was stable at discharge.     FINAL CLINICAL IMPRESSION(S) / ED DIAGNOSES   Final diagnoses:  Laceration of left little finger without damage to nail, foreign body presence unspecified, initial encounter     Rx / DC Orders   ED Discharge Orders     None        Note:  This document was prepared using Dragon voice recognition software and may include unintentional dictation errors.   Cleaster Tinnie LABOR, PA-C 06/19/24 2325    Travis Reche HERO, MD 06/20/24 1530
